# Patient Record
Sex: Male | Born: 2000 | Race: White | Hispanic: No | Marital: Married | Smoking: Current every day smoker
Health system: Southern US, Community
[De-identification: ages and names within clinical notes are randomized; demographics above are authoritative.]

## PROBLEM LIST (undated history)

## (undated) DIAGNOSIS — F909 Attention-deficit hyperactivity disorder, unspecified type: Secondary | ICD-10-CM

## (undated) HISTORY — PX: HYDROCELE EXCISION / REPAIR: SUR1145

## (undated) HISTORY — PX: OTHER SURGICAL HISTORY: SHX169

## (undated) HISTORY — PX: APPENDECTOMY: SHX54

---
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
## 2019-01-30 ENCOUNTER — Other Ambulatory Visit: Payer: Self-pay

## 2019-01-30 DIAGNOSIS — Z20822 Contact with and (suspected) exposure to covid-19: Secondary | ICD-10-CM

## 2019-02-01 LAB — NOVEL CORONAVIRUS, NAA: SARS-CoV-2, NAA: NOT DETECTED

## 2019-06-14 ENCOUNTER — Other Ambulatory Visit: Payer: Self-pay

## 2019-06-14 ENCOUNTER — Ambulatory Visit: Payer: Self-pay | Attending: Internal Medicine

## 2019-06-14 DIAGNOSIS — Z20822 Contact with and (suspected) exposure to covid-19: Secondary | ICD-10-CM

## 2019-06-15 LAB — NOVEL CORONAVIRUS, NAA: SARS-CoV-2, NAA: NOT DETECTED

## 2019-06-16 ENCOUNTER — Ambulatory Visit
Admission: EM | Admit: 2019-06-16 | Discharge: 2019-06-16 | Disposition: A | Discharge status: Home / Self Care | Payer: Self-pay

## 2019-06-18 ENCOUNTER — Other Ambulatory Visit: Payer: Self-pay

## 2019-06-18 ENCOUNTER — Ambulatory Visit: Payer: No Typology Code available for payment source | Attending: Internal Medicine

## 2019-06-18 DIAGNOSIS — U071 COVID-19: Secondary | ICD-10-CM | POA: Insufficient documentation

## 2019-06-18 DIAGNOSIS — Z20822 Contact with and (suspected) exposure to covid-19: Secondary | ICD-10-CM

## 2019-06-19 LAB — NOVEL CORONAVIRUS, NAA: SARS-CoV-2, NAA: DETECTED — AB

## 2019-07-31 ENCOUNTER — Encounter (HOSPITAL_COMMUNITY): Payer: Self-pay | Admitting: Emergency Medicine

## 2019-07-31 ENCOUNTER — Emergency Department (HOSPITAL_COMMUNITY): Payer: Self-pay

## 2019-07-31 ENCOUNTER — Emergency Department (HOSPITAL_COMMUNITY)
Admission: EM | Admit: 2019-07-31 | Discharge: 2019-07-31 | Disposition: A | Discharge status: Home / Self Care | Payer: Self-pay | Attending: Emergency Medicine | Admitting: Emergency Medicine

## 2019-07-31 ENCOUNTER — Other Ambulatory Visit: Payer: Self-pay

## 2019-07-31 DIAGNOSIS — F17210 Nicotine dependence, cigarettes, uncomplicated: Secondary | ICD-10-CM | POA: Insufficient documentation

## 2019-07-31 DIAGNOSIS — M542 Cervicalgia: Secondary | ICD-10-CM | POA: Diagnosis not present

## 2019-07-31 DIAGNOSIS — M545 Low back pain: Secondary | ICD-10-CM | POA: Diagnosis not present

## 2019-07-31 DIAGNOSIS — M62830 Muscle spasm of back: Secondary | ICD-10-CM | POA: Insufficient documentation

## 2019-07-31 MED ORDER — CYCLOBENZAPRINE HCL 5 MG PO TABS: 5.0000 mg | ORAL_TABLET | Freq: Three times a day (TID) | ORAL | 0 refills | Status: AC | PRN

## 2019-07-31 MED ORDER — IBUPROFEN 600 MG PO TABS: 600.0000 mg | ORAL_TABLET | Freq: Four times a day (QID) | ORAL | 0 refills | Status: AC | PRN
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
## 2019-12-19 ENCOUNTER — Emergency Department (HOSPITAL_COMMUNITY): Payer: PRIVATE HEALTH INSURANCE

## 2019-12-19 ENCOUNTER — Emergency Department (HOSPITAL_COMMUNITY)
Admission: EM | Admit: 2019-12-19 | Discharge: 2019-12-20 | Disposition: A | Discharge status: Home / Self Care | Payer: PRIVATE HEALTH INSURANCE | Attending: Emergency Medicine | Admitting: Emergency Medicine

## 2019-12-19 DIAGNOSIS — Y92410 Unspecified street and highway as the place of occurrence of the external cause: Secondary | ICD-10-CM | POA: Diagnosis not present

## 2019-12-19 DIAGNOSIS — S51811A Laceration without foreign body of right forearm, initial encounter: Secondary | ICD-10-CM | POA: Insufficient documentation

## 2019-12-19 DIAGNOSIS — T07XXXA Unspecified multiple injuries, initial encounter: Secondary | ICD-10-CM

## 2019-12-19 DIAGNOSIS — M25521 Pain in right elbow: Secondary | ICD-10-CM | POA: Diagnosis present

## 2019-12-19 DIAGNOSIS — Y998 Other external cause status: Secondary | ICD-10-CM | POA: Diagnosis not present

## 2019-12-19 DIAGNOSIS — Y9355 Activity, bike riding: Secondary | ICD-10-CM | POA: Diagnosis not present

## 2019-12-19 LAB — COMPREHENSIVE METABOLIC PANEL
ALT: 21 U/L (ref 0–44)
AST: 23 U/L (ref 15–41)
Albumin: 4.3 g/dL (ref 3.5–5.0)
Alkaline Phosphatase: 75 U/L (ref 38–126)
Anion gap: 11 (ref 5–15)
BUN: 19 mg/dL (ref 6–20)
CO2: 22 mmol/L (ref 22–32)
Calcium: 9.6 mg/dL (ref 8.9–10.3)
Chloride: 104 mmol/L (ref 98–111)
Creatinine, Ser: 1.17 mg/dL (ref 0.61–1.24)
GFR calc Af Amer: >60 mL/min (ref >60)
GFR calc non Af Amer: >60 mL/min (ref >60)
Glucose, Bld: 116 mg/dL — ABNORMAL HIGH (ref 70–99)
Potassium: 3.9 mmol/L (ref 3.5–5.1)
Sodium: 137 mmol/L (ref 135–145)
Total Bilirubin: 0.5 mg/dL (ref 0.3–1.2)
Total Protein: 7 g/dL (ref 6.5–8.1)

## 2019-12-19 LAB — CBC
HCT: 44.6 % (ref 39.0–52.0)
Hemoglobin: 15.2 g/dL (ref 13.0–17.0)
MCH: 31.2 pg (ref 26.0–34.0)
MCHC: 34.1 g/dL (ref 30.0–36.0)
MCV: 91.6 fL (ref 80.0–100.0)
Platelets: 177 10*3/uL (ref 150–400)
RBC: 4.87 MIL/uL (ref 4.22–5.81)
RDW: 12.6 % (ref 11.5–15.5)
WBC: 7.7 10*3/uL (ref 4.0–10.5)
nRBC: 0 % (ref 0.0–0.2)

## 2019-12-19 LAB — PROTIME-INR
INR: 1.1 (ref 0.8–1.2)
Prothrombin Time: 13.5 seconds (ref 11.4–15.2)

## 2019-12-19 LAB — SAMPLE TO BLOOD BANK

## 2019-12-19 LAB — LACTIC ACID, PLASMA: Lactic Acid, Venous: 1.5 mmol/L (ref 0.5–1.9)

## 2019-12-19 MED ORDER — LIDOCAINE-EPINEPHRINE (PF) 2 %-1:200000 IJ SOLN
20.0000 mL | Freq: Once | INTRAMUSCULAR | Status: AC
  Administered 2019-12-19: 20 mL
  Filled 2019-12-19: qty 20

## 2019-12-19 MED ORDER — OXYCODONE-ACETAMINOPHEN 5-325 MG PO TABS
1.0000 | ORAL_TABLET | Freq: Once | ORAL | Status: AC
  Administered 2019-12-19: 1 via ORAL
  Filled 2019-12-19: qty 1

## 2019-12-19 MED ORDER — FENTANYL CITRATE (PF) 100 MCG/2ML IJ SOLN
100.0000 ug | Freq: Once | INTRAMUSCULAR | Status: AC
  Administered 2019-12-19: 100 ug via INTRAVENOUS
  Filled 2019-12-19: qty 2

## 2019-12-19 MED ORDER — TETANUS-DIPHTH-ACELL PERTUSSIS 5-2.5-18.5 LF-MCG/0.5 IM SUSP
0.5000 mL | Freq: Once | INTRAMUSCULAR | Status: DC
  Filled 2019-12-19: qty 0.5

## 2019-12-19 MED ORDER — HYDROCODONE-ACETAMINOPHEN 5-325 MG PO TABS: 1.0000 | ORAL_TABLET | ORAL | 0 refills | Status: AC | PRN

## 2019-12-19 MED ORDER — IOHEXOL 300 MG/ML  SOLN
100.0000 mL | Freq: Once | INTRAMUSCULAR | Status: AC | PRN
  Administered 2019-12-19: 100 mL via INTRAVENOUS

## 2019-12-19 MED ORDER — HYDROMORPHONE HCL 1 MG/ML IJ SOLN
1.0000 mg | Freq: Once | INTRAMUSCULAR | Status: AC
  Administered 2019-12-19: 1 mg via INTRAVENOUS
  Filled 2019-12-19: qty 1

## 2019-12-19 MED ORDER — ONDANSETRON HCL 4 MG/2ML IJ SOLN
4.0000 mg | Freq: Once | INTRAMUSCULAR | Status: AC
  Administered 2019-12-19: 4 mg via INTRAVENOUS
  Filled 2019-12-19: qty 2
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
## 2019-12-19 NOTE — Discharge Instructions (Signed)
Clean all of your wounds with soap and water daily and apply an antibiotic ointment such as Neosporin.  Use ice on sore areas 3-4 times a day for 2 days after that heat can help.  Keep the lacerations clean and dry while working, until the sutures are removed.  Be careful about lifting or bumping the right arm, so that the stitches, and wounds, are not damaged.  Return here or see your doctor for problems.  Have the sutures removed from the wounds in about 10 days.  Use Tylenol or Motrin for pain.  We sent a prescription for narcotic pain reliever to your pharmacy.
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
## 2019-12-19 NOTE — ED Notes (Signed)
Pt to ct 
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
